# Patient Record
Sex: Male | Born: 1975 | Race: Black or African American | Hispanic: No | Marital: Single | Smoking: Never smoker
Health system: Southern US, Community
[De-identification: ages and names within clinical notes are randomized; demographics above are authoritative.]

---

## 2018-05-20 ENCOUNTER — Emergency Department (HOSPITAL_COMMUNITY): Payer: BLUE CROSS/BLUE SHIELD

## 2018-05-20 ENCOUNTER — Emergency Department (HOSPITAL_COMMUNITY)
Admission: EM | Admit: 2018-05-20 | Discharge: 2018-05-20 | Disposition: A | Discharge status: Home / Self Care | Payer: BLUE CROSS/BLUE SHIELD | Attending: Emergency Medicine | Admitting: Emergency Medicine

## 2018-05-20 ENCOUNTER — Encounter (HOSPITAL_COMMUNITY): Payer: Self-pay | Admitting: Emergency Medicine

## 2018-05-20 DIAGNOSIS — Y999 Unspecified external cause status: Secondary | ICD-10-CM | POA: Diagnosis not present

## 2018-05-20 DIAGNOSIS — Y929 Unspecified place or not applicable: Secondary | ICD-10-CM | POA: Insufficient documentation

## 2018-05-20 DIAGNOSIS — S40812A Abrasion of left upper arm, initial encounter: Secondary | ICD-10-CM | POA: Insufficient documentation

## 2018-05-20 DIAGNOSIS — T07XXXA Unspecified multiple injuries, initial encounter: Secondary | ICD-10-CM | POA: Insufficient documentation

## 2018-05-20 DIAGNOSIS — Y9389 Activity, other specified: Secondary | ICD-10-CM | POA: Diagnosis not present

## 2018-05-20 DIAGNOSIS — S20312A Abrasion of left front wall of thorax, initial encounter: Secondary | ICD-10-CM | POA: Insufficient documentation

## 2018-05-20 DIAGNOSIS — S4992XA Unspecified injury of left shoulder and upper arm, initial encounter: Secondary | ICD-10-CM | POA: Diagnosis present

## 2018-05-20 MED ORDER — IBUPROFEN 800 MG PO TABS
800.0000 mg | ORAL_TABLET | Freq: Once | ORAL | Status: AC
  Administered 2018-05-20: 800 mg via ORAL
  Filled 2018-05-20: qty 1
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
